# Patient Record
Sex: Female | Born: 1990 | Race: Black or African American | Hispanic: No | Marital: Single | Smoking: Current every day smoker
Health system: Southern US, Community
[De-identification: ages and names within clinical notes are randomized; demographics above are authoritative.]

---
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
## 2009-11-06 IMAGING — CR DG HIP COMPLETE 2+V*R*
2 series · 2 of 2 positions shown · non-contrast
Comparison: None

CLINICAL DATA: Hip pain.  No recent injury.

RIGHT HIP - COMPLETE 2+ VIEW

[t pelvis a.p.]
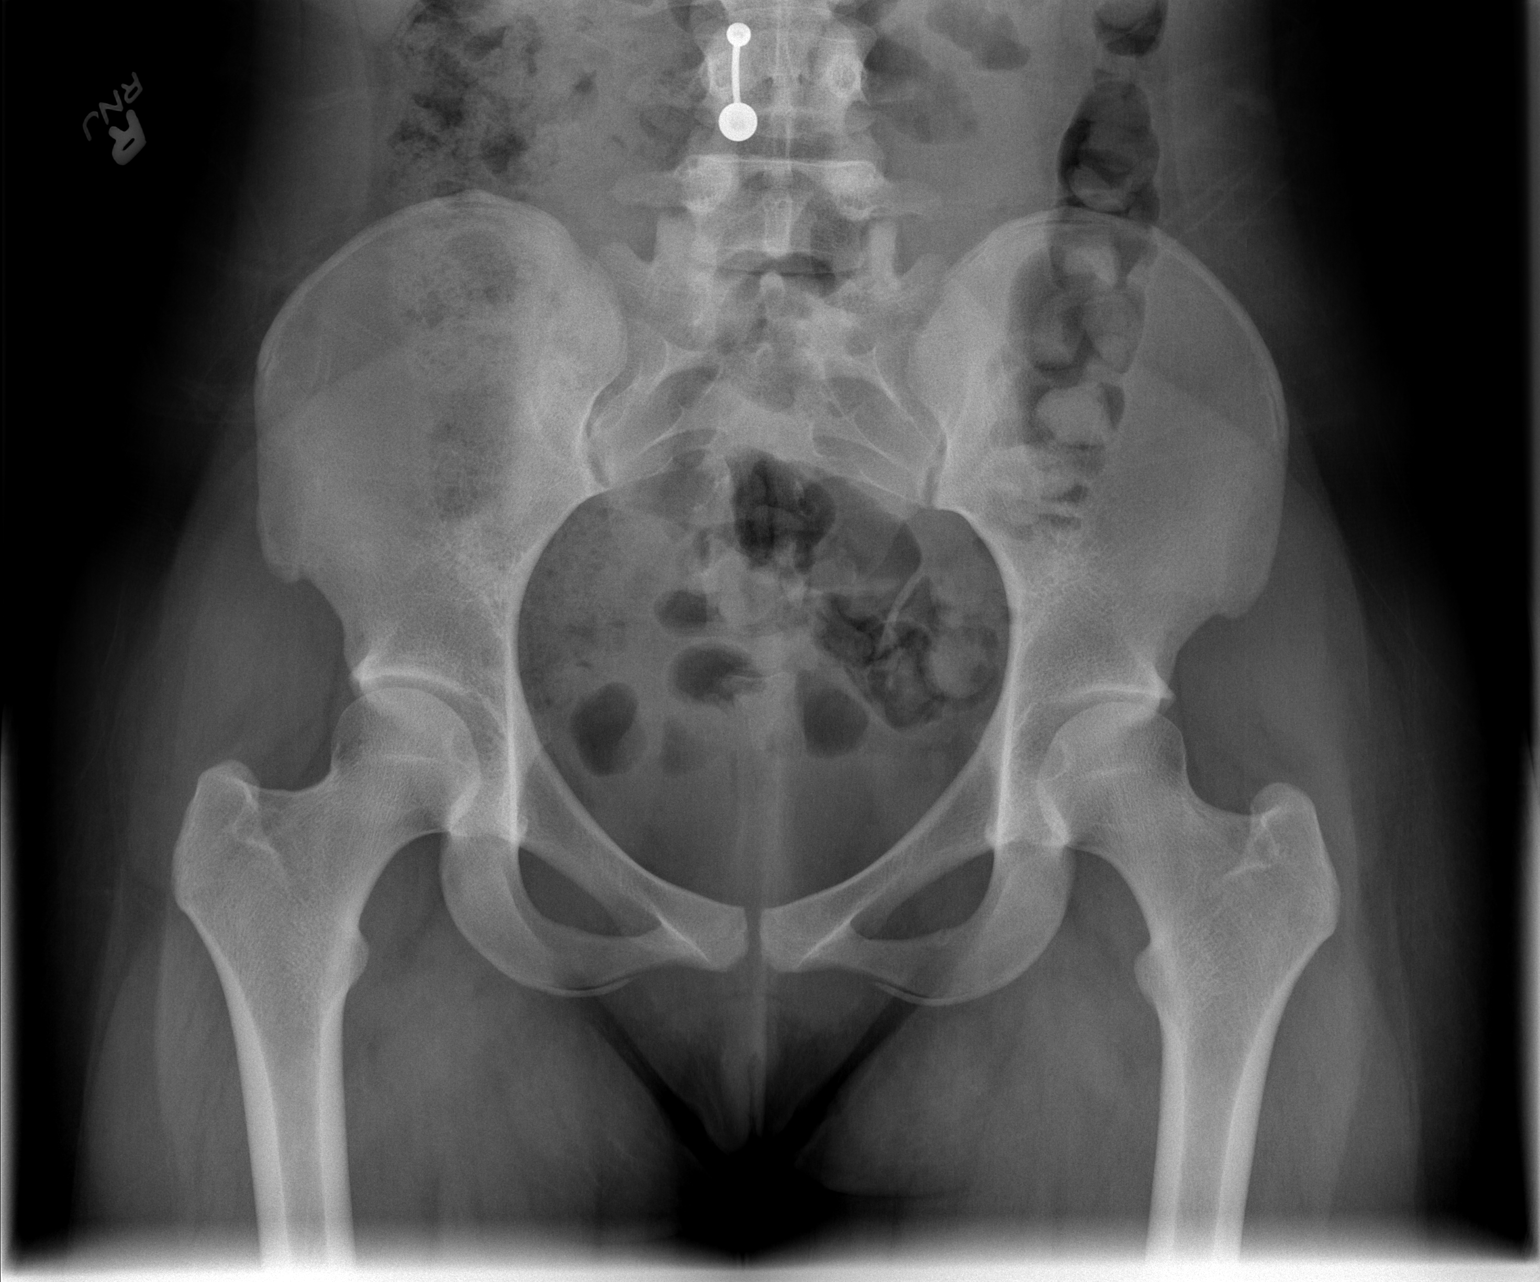

[t hip frog leg right]
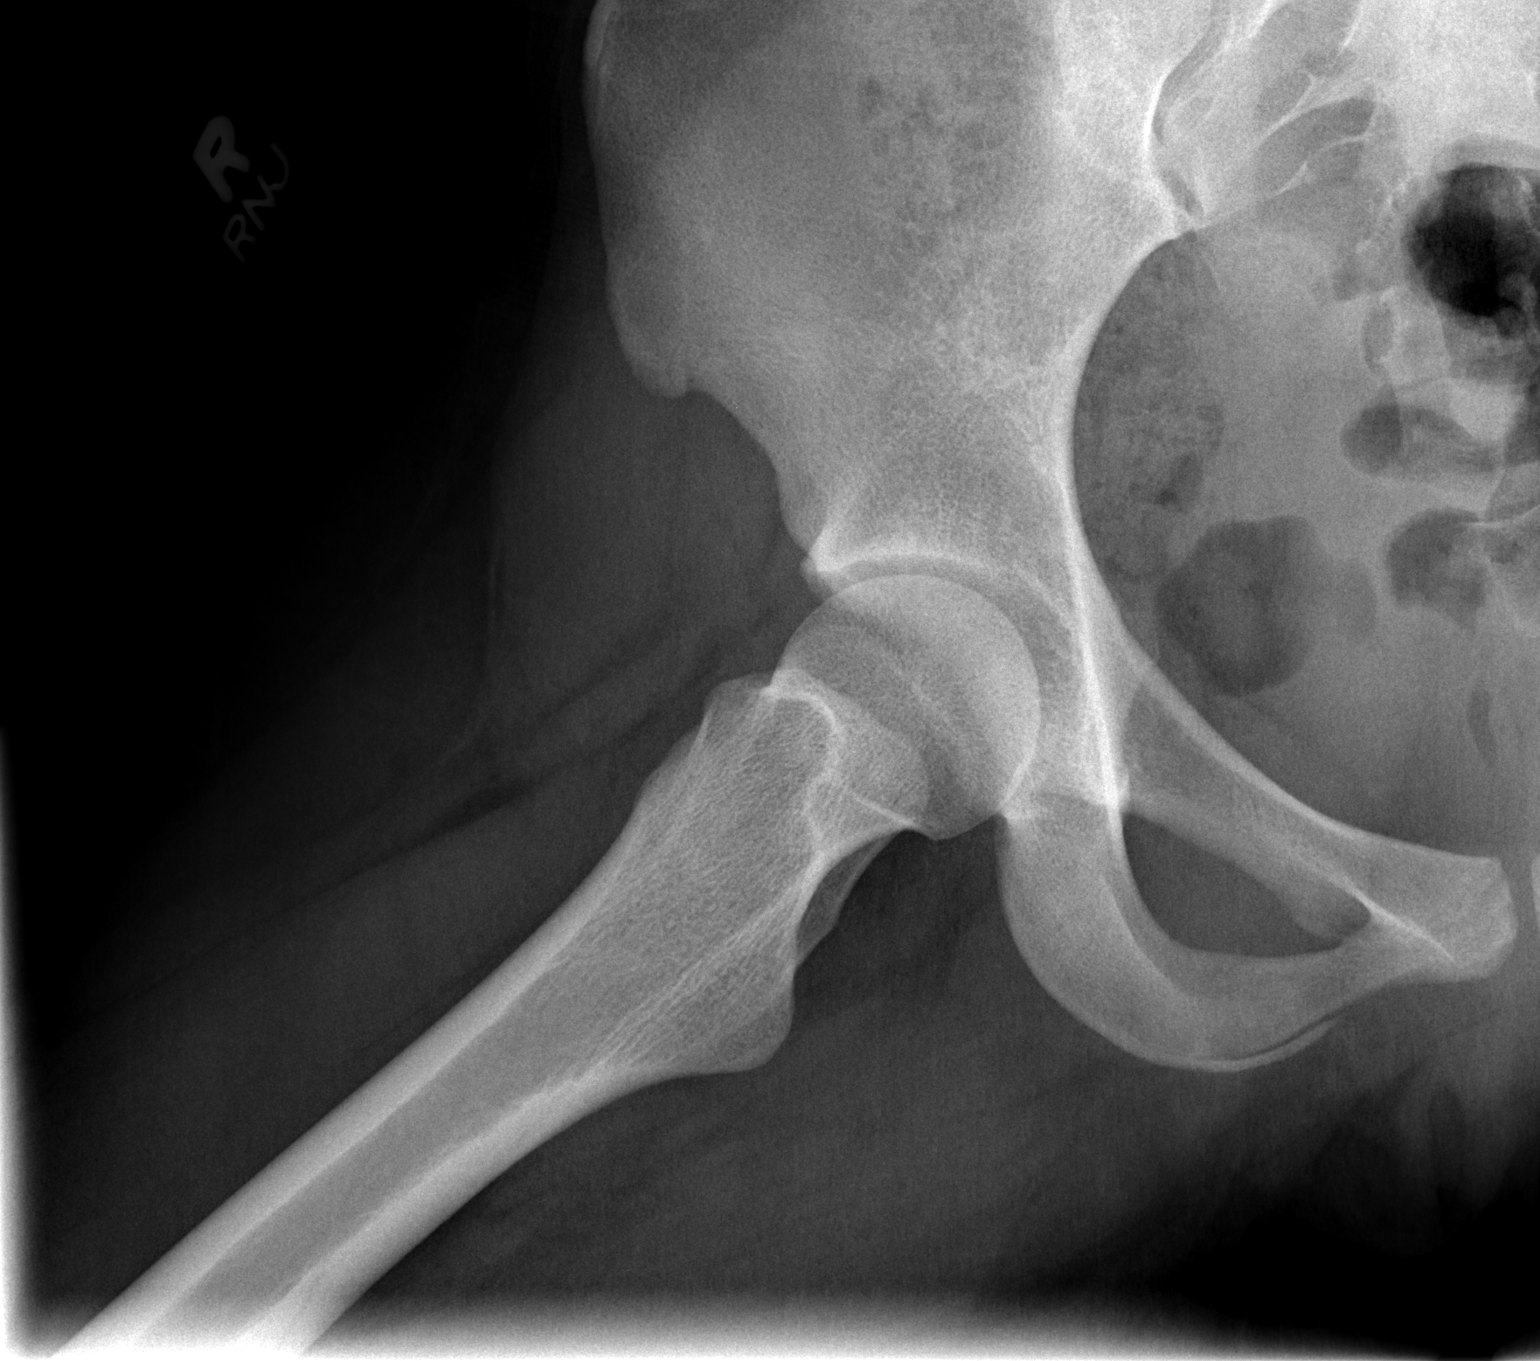

[2 of 2 positions shown; findings below may reference images not displayed]

FINDINGS: Both femoral heads are located.  Sacroiliac joints are
symmetric.  No acute fracture.
IMPRESSION: No acute findings about the right hip.

## 2010-10-18 LAB — PREGNANCY, URINE: Preg Test, Ur: NEGATIVE

## 2010-10-18 LAB — URINALYSIS, ROUTINE W REFLEX MICROSCOPIC
Glucose, UA: NEGATIVE
Hgb urine dipstick: NEGATIVE
Nitrite: NEGATIVE
Specific Gravity, Urine: 1.02
pH: 5.5

## 2010-10-18 LAB — URINE MICROSCOPIC-ADD ON

## 2014-12-02 ENCOUNTER — Ambulatory Visit (INDEPENDENT_AMBULATORY_CARE_PROVIDER_SITE_OTHER): Payer: BLUE CROSS/BLUE SHIELD | Admitting: Family Medicine

## 2014-12-02 VITALS — BP 112/80 | HR 80 | Temp 98.1°F | Resp 16 | Ht <= 58 in | Wt 144.2 lb

## 2014-12-02 DIAGNOSIS — S60440A External constriction of right index finger, initial encounter: Secondary | ICD-10-CM | POA: Diagnosis not present

## 2014-12-02 DIAGNOSIS — S60450A Superficial foreign body of right index finger, initial encounter: Secondary | ICD-10-CM | POA: Diagnosis not present

## 2014-12-02 DIAGNOSIS — S60459A Superficial foreign body of unspecified finger, initial encounter: Secondary | ICD-10-CM

## 2014-12-02 NOTE — Progress Notes (Signed)
   Subjective:  This chart was scribed for Norberto SorensonEva Zayla Agar MD, by Veverly FellsHatice Demirci,scribe, at [facility].  This patient was seen in room 14 and the patient's care was started at 10:05 AM.    Patient ID: Leslie OysterAlissa Flamenco, female    DOB: 10-09-1990, 24 y.o.   MRN: [id number] Chief Complaint  Patient presents with  . Hand Pain    pt has a ring on her index finger that is stuck, pt would like to get it removed  . Numbness    ring is causing her finger to go numb/ left hand    HPI  HPI Comments: Leslie Ramirez is a 24 y.o. female who presents to the [facility] with a ring stuck on her right index finger which has been stuck for the past 12 hours.  Patient has worn the ring plenty of times in the past.  She has no other complaints today.      History reviewed. No pertinent past medical history.  No current outpatient prescriptions on file prior to visit.   No current facility-administered medications on file prior to visit.    Not on File     Review of Systems  Constitutional: Negative for fever and chills.  Eyes: Negative for pain, redness and itching.  Respiratory: Negative for cough and choking.   Musculoskeletal: Negative for neck pain and neck stiffness.       Objective:   Physical Exam  Constitutional: She is oriented to person, place, and time. She appears well-developed and well-nourished. No distress.  HENT:  Head: Normocephalic and atraumatic.  Eyes: Pupils are equal, round, and reactive to light.  Neck: Normal range of motion.  Pulmonary/Chest: No respiratory distress.  Musculoskeletal: Normal range of motion.  Second phalanx with mild edema and erythema throughout entire phalanx distal to ring, no significant tenderness.  Able to manipulate skin enough to see spaces beneath the ring.   Neurological: She is alert and oriented to person, place, and time.    Filed Vitals:   12/02/14 0956  BP: 112/80  Pulse: 80  Temp: 98.1 F (36.7 C)    TempSrc: Oral  Resp: 16  Height: 4' 9.5" (1.461 m)  Weight: 144 lb 3.2 oz (65.409 kg)  SpO2: 98%         Assessment & Plan:   1. Superficial foreign body finger without major open wound, no infection, initial encounter   2. External constriction of right index finger, initial encounter   Used umbilical tape to decompress swelling and remove constricting ring - very painful but pt tolerated well and ring was able to be removed without complications.   I personally performed the services described in this documentation, which was scribed in my presence. The recorded information has been reviewed and considered, and addended by me as needed.  Norberto SorensonEva Katalyn Matin, MD MPH
